# Patient Record
Sex: Female | Born: 1980 | Race: White | Hispanic: No | Marital: Single
Health system: Southern US, Community
[De-identification: ages and names within clinical notes are randomized; demographics above are authoritative.]

---

## 2005-11-08 ENCOUNTER — Emergency Department: Payer: Self-pay | Admitting: Emergency Medicine

## 2007-10-28 ENCOUNTER — Emergency Department: Payer: Self-pay | Admitting: Emergency Medicine

## 2008-06-18 ENCOUNTER — Observation Stay: Payer: Self-pay

## 2008-06-19 ENCOUNTER — Observation Stay: Payer: Self-pay | Admitting: Obstetrics and Gynecology

## 2008-06-20 ENCOUNTER — Observation Stay: Payer: Self-pay

## 2008-06-22 ENCOUNTER — Inpatient Hospital Stay: Payer: Self-pay

## 2008-11-02 ENCOUNTER — Emergency Department: Payer: Self-pay | Admitting: Unknown Physician Specialty

## 2009-02-19 ENCOUNTER — Emergency Department: Payer: Self-pay | Admitting: Emergency Medicine

## 2012-05-26 ENCOUNTER — Inpatient Hospital Stay: Payer: Self-pay | Admitting: Obstetrics & Gynecology

## 2012-05-26 LAB — PROTEIN / CREATININE RATIO, URINE
Creatinine, Urine: 134.4 mg/dL — ABNORMAL HIGH (ref 30.0–125.0)
Protein, Random Urine: 23 mg/dL — ABNORMAL HIGH (ref 0–12)

## 2012-05-26 LAB — DRUG SCREEN, URINE
Barbiturates, Ur Screen: NEGATIVE (ref ?–200)
Cannabinoid 50 Ng, Ur ~~LOC~~: POSITIVE (ref ?–50)
Cocaine Metabolite,Ur ~~LOC~~: NEGATIVE (ref ?–300)
MDMA (Ecstasy)Ur Screen: NEGATIVE (ref ?–500)
Methadone, Ur Screen: NEGATIVE (ref ?–300)
Phencyclidine (PCP) Ur S: NEGATIVE (ref ?–25)
Tricyclic, Ur Screen: NEGATIVE (ref ?–1000)

## 2012-05-26 LAB — CBC WITH DIFFERENTIAL/PLATELET
Basophil #: 0.1 10*3/uL (ref 0.0–0.1)
Eosinophil #: 0 10*3/uL (ref 0.0–0.7)
Eosinophil %: 0.2 %
Lymphocyte #: 5.5 10*3/uL — ABNORMAL HIGH (ref 1.0–3.6)
Lymphocyte %: 27.8 %
MCH: 28.3 pg (ref 26.0–34.0)
Monocyte %: 4.5 %
Neutrophil #: 13.3 10*3/uL — ABNORMAL HIGH (ref 1.4–6.5)
Neutrophil %: 67.1 %
Platelet: 230 10*3/uL (ref 150–440)
RBC: 4.46 10*6/uL (ref 3.80–5.20)
RDW: 13.4 % (ref 11.5–14.5)
WBC: 19.8 10*3/uL — ABNORMAL HIGH (ref 3.6–11.0)

## 2013-03-07 ENCOUNTER — Emergency Department: Payer: Self-pay | Admitting: Emergency Medicine

## 2013-03-07 LAB — URINALYSIS, COMPLETE
Bacteria: NONE SEEN
Bilirubin,UR: NEGATIVE
Blood: NEGATIVE
GLUCOSE, UR: NEGATIVE mg/dL (ref 0–75)
KETONE: NEGATIVE
LEUKOCYTE ESTERASE: NEGATIVE
Nitrite: NEGATIVE
PH: 7 (ref 4.5–8.0)
Protein: NEGATIVE
RBC,UR: NONE SEEN /HPF (ref 0–5)
SPECIFIC GRAVITY: 1.001 (ref 1.003–1.030)
Squamous Epithelial: <1
WBC UR: NONE SEEN /HPF (ref 0–5)
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
## 2015-05-28 ENCOUNTER — Emergency Department
Admission: EM | Admit: 2015-05-28 | Discharge: 2015-05-29 | Disposition: A | Discharge status: Home / Self Care | Payer: Self-pay | Attending: Emergency Medicine | Admitting: Emergency Medicine

## 2015-05-28 ENCOUNTER — Emergency Department: Payer: Self-pay

## 2015-05-28 ENCOUNTER — Encounter: Payer: Self-pay | Admitting: Emergency Medicine

## 2015-05-28 DIAGNOSIS — T404X1A Poisoning by other synthetic narcotics, accidental (unintentional), initial encounter: Secondary | ICD-10-CM | POA: Insufficient documentation

## 2015-05-28 DIAGNOSIS — T401X1A Poisoning by heroin, accidental (unintentional), initial encounter: Secondary | ICD-10-CM | POA: Insufficient documentation

## 2015-05-28 DIAGNOSIS — T50901A Poisoning by unspecified drugs, medicaments and biological substances, accidental (unintentional), initial encounter: Secondary | ICD-10-CM

## 2015-05-28 LAB — URINE DRUG SCREEN, QUALITATIVE (ARMC ONLY)
AMPHETAMINES, UR SCREEN: POSITIVE — AB
Barbiturates, Ur Screen: NOT DETECTED
Benzodiazepine, Ur Scrn: POSITIVE — AB
CANNABINOID 50 NG, UR ~~LOC~~: POSITIVE — AB
Cocaine Metabolite,Ur ~~LOC~~: POSITIVE — AB
MDMA (ECSTASY) UR SCREEN: NOT DETECTED
Methadone Scn, Ur: POSITIVE — AB
OPIATE, UR SCREEN: POSITIVE — AB
PHENCYCLIDINE (PCP) UR S: NOT DETECTED
Tricyclic, Ur Screen: NOT DETECTED

## 2015-05-28 LAB — CBC WITH DIFFERENTIAL/PLATELET
BASOS ABS: 0.1 10*3/uL (ref 0–0.1)
BASOS PCT: 1 %
EOS ABS: 0.4 10*3/uL (ref 0–0.7)
Eosinophils Relative: 5 %
HCT: 38.2 % (ref 35.0–47.0)
HEMOGLOBIN: 12.3 g/dL (ref 12.0–16.0)
Lymphocytes Relative: 41 %
Lymphs Abs: 3.5 10*3/uL (ref 1.0–3.6)
MCH: 26.5 pg (ref 26.0–34.0)
MCHC: 32.2 g/dL (ref 32.0–36.0)
MCV: 82 fL (ref 80.0–100.0)
MONOS PCT: 8 %
Monocytes Absolute: 0.7 10*3/uL (ref 0.2–0.9)
NEUTROS ABS: 4 10*3/uL (ref 1.4–6.5)
NEUTROS PCT: 45 %
Platelets: 396 10*3/uL (ref 150–440)
RBC: 4.65 MIL/uL (ref 3.80–5.20)
RDW: 14.9 % — ABNORMAL HIGH (ref 11.5–14.5)
WBC: 8.7 10*3/uL (ref 3.6–11.0)

## 2015-05-28 LAB — COMPREHENSIVE METABOLIC PANEL
ALK PHOS: 123 U/L (ref 38–126)
ALT: 89 U/L — AB (ref 14–54)
ANION GAP: 9 (ref 5–15)
AST: 30 U/L (ref 15–41)
Albumin: 4.3 g/dL (ref 3.5–5.0)
BILIRUBIN TOTAL: 0.3 mg/dL (ref 0.3–1.2)
BUN: 14 mg/dL (ref 6–20)
CALCIUM: 9.6 mg/dL (ref 8.9–10.3)
CO2: 26 mmol/L (ref 22–32)
CREATININE: 0.89 mg/dL (ref 0.44–1.00)
Chloride: 105 mmol/L (ref 101–111)
Glucose, Bld: 101 mg/dL — ABNORMAL HIGH (ref 65–99)
Potassium: 3.1 mmol/L — ABNORMAL LOW (ref 3.5–5.1)
Sodium: 140 mmol/L (ref 135–145)
TOTAL PROTEIN: 7.8 g/dL (ref 6.5–8.1)

## 2015-05-28 LAB — URINALYSIS COMPLETE WITH MICROSCOPIC (ARMC ONLY)
BILIRUBIN URINE: NEGATIVE
Bacteria, UA: NONE SEEN
Glucose, UA: NEGATIVE mg/dL
Hgb urine dipstick: NEGATIVE
KETONES UR: NEGATIVE mg/dL
Leukocytes, UA: NEGATIVE
NITRITE: NEGATIVE
PH: 6 (ref 5.0–8.0)
Protein, ur: 100 mg/dL — AB
RBC / HPF: NONE SEEN RBC/hpf (ref 0–5)
SPECIFIC GRAVITY, URINE: 1.024 (ref 1.005–1.030)

## 2015-05-28 LAB — PREGNANCY, URINE: PREG TEST UR: NEGATIVE
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
## 2015-05-28 NOTE — Discharge Instructions (Signed)
Accidental Overdose °A drug overdose occurs when a chemical substance (drug or medication) is used in amounts large enough to overcome a person. This may result in severe illness or death. This is a type of poisoning. Accidental overdoses of medications or other substances come from a variety of reasons. When this happens accidentally, it is often because the person taking the substance does not know enough about what they have taken. Drugs which commonly cause overdose deaths are alcohol, psychotropic medications (medications which affect the mind), pain medications, illegal drugs (street drugs) such as cocaine and heroin, and multiple drugs taken at the same time. It may result from careless behavior (such as over-indulging at a party). Other causes of overdose may include multiple drug use, a lapse in memory, or drug use after a period of no drug use.  °Sometimes overdosing occurs because a person cannot remember if they have taken their medication.  °A common unintentional overdose in young children involves multi-vitamins containing iron. Iron is a part of the hemoglobin molecule in blood. It is used to transport oxygen to living cells. When taken in small amounts, iron allows the body to restock hemoglobin. In large amounts, it causes problems in the body. If this overdose is not treated, it can lead to death. °Never take medicines that show signs of tampering or do not seem quite right. Never take medicines in the dark or in poor lighting. Read the label and check each dose of medicine before you take it. When adults are poisoned, it happens most often through carelessness or lack of information. Taking medicines in the dark or taking medicine prescribed for someone else to treat the same type of problem is a dangerous practice. °SYMPTOMS  °Symptoms of overdose depend on the medication and amount taken. They can vary from over-activity with stimulant over-dosage, to sleepiness from depressants such as  alcohol, narcotics and tranquilizers. Confusion, dizziness, nausea and vomiting may be present. If problems are severe enough coma and death may result. °DIAGNOSIS  °Diagnosis and management are generally straightforward if the drug is known. Otherwise it is more difficult. At times, certain symptoms and signs exhibited by the patient, or blood tests, can reveal the drug in question.  °TREATMENT  °In an emergency department, most patients can be treated with supportive measures. Antidotes may be available if there has been an overdose of opioids or benzodiazepines. A rapid improvement will often occur if this is the cause of overdose. °At home or away from medical care: °· There may be no immediate problems or warning signs in children. °· Not everything works well in all cases of poisoning. °· Take immediate action. Poisons may act quickly. °· If you think someone has swallowed medicine or a household product, and the person is unconscious, having seizures (convulsions), or is not breathing, immediately call for an ambulance. °IF a person is conscious and appears to be doing OK but has swallowed a poison: °· Do not wait to see what effect the poison will have. Immediately call a poison control center (listed in the white pages of your telephone book under "Poison Control" or inside the front cover with other emergency numbers). Some poison control centers have TTY capability for the deaf. Check with your local center if you or someone in your family requires this service. °· Keep the container so you can read the label on the product for ingredients. °· Describe what, when, and how much was taken and the age and condition of the person poisoned.   Inform them if the person is vomiting, choking, drowsy, shows a change in color or temperature of skin, is conscious or unconscious, or is convulsing.  Do not cause vomiting unless instructed by medical personnel. Do not induce vomiting or force liquids into a person who  is convulsing, unconscious, or very drowsy. Stay calm and in control.   Activated charcoal also is sometimes used in certain types of poisoning and you may wish to add a supply to your emergency medicines. It is available without a prescription. Call a poison control center before using this medication. PREVENTION  Thousands of children die every year from unintentional poisoning. This may be from household chemicals, poisoning from carbon monoxide in a car, taking their parent's medications, or simply taking a few iron pills or vitamins with iron. Poisoning comes from unexpected sources.  Store medicines out of the sight and reach of children, preferably in a locked cabinet. Do not keep medications in a food cabinet. Always store your medicines in a secure place. Get rid of expired medications.  If you have children living with you or have them as occasional guests, you should have child-resistant caps on your medicine containers. Keep everything out of reach. Child proof your home.  If you are called to the telephone or to answer the door while you are taking a medicine, take the container with you or put the medicine out of the reach of small children.  Do not take your medication in front of children. Do not tell your child how good a medication is and how good it is for them. They may get the idea it is more of a treat.  If you are an adult and have accidentally taken an overdose, you need to consider how this happened and what can be done to prevent it from happening again. If this was from a street drug or alcohol, determine if there is a problem that needs addressing. If you are not sure a problems exists, it is easy to talk to a professional and ask them if they think you have a problem. It is better to handle this problem in this way before it happens again and has a much worse consequence.   This information is not intended to replace advice given to you by your health care provider. Make  sure you discuss any questions you have with your health care provider.   Document Released: 04/17/2004 Document Revised: 02/22/2014 Document Reviewed: 07/22/2014 Elsevier Interactive Patient Education 2016 [employer]  Do not do drugs anymore. Make sure you go around somebody who has some Narcan if you do do drugs again you almost died this time
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
## 2015-05-29 NOTE — ED Notes (Signed)
Discharge instructions reviewed with patient. Patient verbalized understanding. Patient ambulated to lobby without difficulty.   

## 2020-11-18 ENCOUNTER — Emergency Department: Payer: Medicaid Other

## 2020-11-18 ENCOUNTER — Ambulatory Visit
Admission: EM | Admit: 2020-11-18 | Discharge: 2020-11-18 | Disposition: A | Discharge status: Home / Self Care | Payer: No Typology Code available for payment source | Attending: Emergency Medicine | Admitting: Emergency Medicine

## 2020-11-18 ENCOUNTER — Other Ambulatory Visit: Payer: Self-pay

## 2020-11-18 DIAGNOSIS — W540XXA Bitten by dog, initial encounter: Secondary | ICD-10-CM | POA: Insufficient documentation

## 2020-11-18 DIAGNOSIS — S71052A Open bite, left hip, initial encounter: Secondary | ICD-10-CM | POA: Insufficient documentation

## 2020-11-18 DIAGNOSIS — T7421XA Adult sexual abuse, confirmed, initial encounter: Secondary | ICD-10-CM

## 2020-11-18 DIAGNOSIS — Z23 Encounter for immunization: Secondary | ICD-10-CM | POA: Insufficient documentation

## 2020-11-18 DIAGNOSIS — S71152A Open bite, left thigh, initial encounter: Secondary | ICD-10-CM | POA: Diagnosis not present

## 2020-11-18 DIAGNOSIS — Z0441 Encounter for examination and observation following alleged adult rape: Secondary | ICD-10-CM | POA: Insufficient documentation

## 2020-11-18 LAB — POC URINE PREG, ED: Preg Test, Ur: NEGATIVE

## 2020-11-18 MED ORDER — ULIPRISTAL ACETATE 30 MG PO TABS
30.0000 mg | ORAL_TABLET | Freq: Once | ORAL | Status: AC
  Administered 2020-11-18: 30 mg via ORAL
  Filled 2020-11-18: qty 1

## 2020-11-18 MED ORDER — METRONIDAZOLE 500 MG PO TABS
2000.0000 mg | ORAL_TABLET | Freq: Once | ORAL | Status: AC
  Administered 2020-11-18: 2000 mg via ORAL
  Filled 2020-11-18: qty 4

## 2020-11-18 MED ORDER — LEVETIRACETAM 750 MG PO TABS: 750.0000 mg | ORAL_TABLET | Freq: Two times a day (BID) | ORAL | 0 refills | Status: AC

## 2020-11-18 MED ORDER — PROMETHAZINE HCL 25 MG PO TABS
25.0000 mg | ORAL_TABLET | Freq: Four times a day (QID) | ORAL | Status: DC | PRN
  Administered 2020-11-18: 25 mg via ORAL
  Filled 2020-11-18: qty 1

## 2020-11-18 MED ORDER — AZITHROMYCIN 500 MG PO TABS
1000.0000 mg | ORAL_TABLET | Freq: Once | ORAL | Status: AC
  Administered 2020-11-18: 1000 mg via ORAL
  Filled 2020-11-18: qty 2

## 2020-11-18 MED ORDER — LEVETIRACETAM 750 MG PO TABS
750.0000 mg | ORAL_TABLET | Freq: Once | ORAL | Status: AC
  Administered 2020-11-18: 750 mg via ORAL
  Filled 2020-11-18: qty 1

## 2020-11-18 MED ORDER — IBUPROFEN 600 MG PO TABS: 600.0000 mg | ORAL_TABLET | Freq: Three times a day (TID) | ORAL | 0 refills | Status: AC | PRN

## 2020-11-18 MED ORDER — GABAPENTIN 300 MG PO CAPS
300.0000 mg | ORAL_CAPSULE | Freq: Once | ORAL | Status: AC
  Administered 2020-11-18: 300 mg via ORAL
  Filled 2020-11-18: qty 1

## 2020-11-18 MED ORDER — AMOXICILLIN-POT CLAVULANATE 875-125 MG PO TABS: 1.0000 | ORAL_TABLET | Freq: Two times a day (BID) | ORAL | 0 refills | Status: AC

## 2020-11-18 MED ORDER — TETANUS-DIPHTH-ACELL PERTUSSIS 5-2.5-18.5 LF-MCG/0.5 IM SUSY
0.5000 mL | PREFILLED_SYRINGE | Freq: Once | INTRAMUSCULAR | Status: AC
Start: 2020-11-18 — End: 2020-11-18
  Administered 2020-11-18: 0.5 mL via INTRAMUSCULAR
  Filled 2020-11-18: qty 0.5

## 2020-11-18 MED ORDER — LIDOCAINE HCL (PF) 1 % IJ SOLN
1.0000 mL | Freq: Once | INTRAMUSCULAR | Status: AC
  Administered 2020-11-18: 1 mL
  Filled 2020-11-18: qty 5

## 2020-11-18 MED ORDER — CEFTRIAXONE SODIUM 1 G IJ SOLR
500.0000 mg | Freq: Once | INTRAMUSCULAR | Status: AC
  Administered 2020-11-18: 500 mg via INTRAMUSCULAR
  Filled 2020-11-18: qty 10

## 2020-11-18 NOTE — ED Notes (Signed)
Pt cleared by MD Roxan Hockey at this time. SANE RN paged at this time.

## 2020-11-18 NOTE — ED Notes (Signed)
Pt is unable to give urine specimen at this time.
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
## 2020-11-18 NOTE — Discharge Instructions (Addendum)
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 program. Check with your pharmacy or law enforcement to find a location. If you cannot return the medication, ask your care team how to get rid of this medication safely. NOTE: This sheet is a summary. It may not cover all possible information. If you have questions about this medicine, talk to your doctor, pharmacist, or health care provider.  2022 Elsevier/Gold  Standard (2020-03-11 09:56:16)    Ulipristal Tablets  What is this medication? ULIPRISTAL (UE li pris tal) can prevent pregnancy. It should be taken as soon as possible in the 5 days (120 hours) after unprotected sex or if you think your contraceptive didn't work. It belongs to a group of medications called emergency contraceptives. It does not prevent HIV or other sexually transmitted  infections (STIs). This medicine may be used for other purposes; ask your health care provider or pharmacist if you have questions. COMMON BRAND NAME(S): ella What should I tell my care team before I take this medication? They need to know if you have any of these conditions: Liver disease An unusual or allergic reaction to ulipristal, other medications, foods, dyes, or preservatives Pregnant or trying to get pregnant Breast-feeding How should I use this medication? Take this medication by mouth with or without food. Your care team may want you to use a quick-response pregnancy test prior to using the tablets. Take your medication as soon as possible and not more than 5 days (120 hours) after the event. This medication can be taken at any time during your menstrual cycle. Follow the dose instructions of your care team exactly. Contact your care team right away if you vomit within 3 hours of taking your medication to discuss if you need to take another tablet. A patient package insert for the product will be given with each prescription and refill. Read this sheet carefully each time. The sheet may change frequently. Contact your care team about the use of this medication in children. Special care may be needed. Overdosage: If you think you have taken too much of this medicine contact a poison control center or emergency room at once. NOTE: This medicine is only for you. Do not share this medicine with others. What if I miss a dose? This medication is not for regular use. If you vomit within 3 hours of taking your dose, contact your care team for instructions. What may interact with this medication? This medication may interact with the following: Barbiturates such as phenobarbital or primidone Birth control pills Bosentan Carbamazepine Certain medications for fungal infections like griseofulvin, itraconazole, and ketoconazole Certain medications for HIV or AIDS or  hepatitis Dabigatran Digoxin Felbamate Fexofenadine Oxcarbazepine Phenytoin Rifampin St. John's Wort Topiramate This list may not describe all possible interactions. Give your health care provider a list of all the medicines, herbs, non-prescription drugs, or dietary supplements you use. Also tell them if you smoke, drink alcohol, or use illegal drugs. Some items may interact with your medicine. What should I watch for while using this medication? Your period may begin a few days earlier or later than expected. If your period is more than 7 days late, pregnancy is possible. See your care team as soon as you can and get a pregnancy test. Talk to your care team before taking this medication if you know or suspect that you are pregnant. Contact your care team if you think you may be pregnant and you have taken this medication. If you have severe abdominal pain about 3 to 5 weeks after taking this medication, you may have a pregnancy outside the womb, which is called an ectopic or tubal pregnancy. Call your care team or go to the nearest emergency room right away if you think this is happening. Discuss birth control options with your care team. Emergency birth control is not to be used routinely to prevent pregnancy. It should not be used more than once  in the same cycle. Birth control pills may not work properly while you are taking this medication. Wait at least 5 days after taking this medication to start or continue other hormone based birth control. Be sure to use a reliable barrier contraceptive method (such as a condom with spermicide) between the time you take this medication and your next period. This medication does not protect you against HIV infection (AIDS) or any other sexually transmitted diseases (STDs). What side effects may I notice from receiving this medication? Side effects that you should report to your care team as soon as possible: Allergic reactions-skin rash, itching, hives,  swelling of the face, lips, tongue, or throat Side effects that usually do not require medical attention (report to your care team if they continue or are bothersome): Dizziness Fatigue Headache Irregular menstrual cycles or spotting Menstrual cramps Nausea Stomach pain This list may not describe all possible side effects. Call your doctor for medical advice about side effects. You may report side effects to FDA at 1-800-FDA-1088.     Promethazine (pack of 3 for home use) Also known as:  Phenergan  Promethazine Tablets  What is this medication? PROMETHAZINE (proe METH a zeen) prevents and treats the symptoms of an allergic reaction. It works by blocking histamine, a substance released by the body during an allergic reaction. It may also help you relax, go to sleep, and relieve nausea, vomiting, or pain before or after procedures. It can also prevent and treat motion sickness. It works by helping your nervous system calm down by blocking substances in the body that may cause nausea and vomiting. It belongs to a group of medications called antihistamines. This medicine may be used for other purposes; ask your health care provider or pharmacist if you have questions. COMMON BRAND NAME(S): Phenergan What should I tell my care team before I take this medication? They need to know if you have any of these conditions: Blockage in your bowel Diabetes Glaucoma Have trouble controlling your muscles Heart disease Liver disease Low blood counts, like low white cell, platelet, or red cell counts Lung or breathing disease, like asthma Parkinson's disease Prostate disease Seizures Stomach or intestine problems Trouble passing urine An unusual or allergic reaction to promethazine, sulfites, other medications, foods, dyes, or preservatives Pregnant or trying to get pregnant Breast-feeding How should I use this medication? Take this medication by mouth with a glass of water. Follow the  directions on the prescription label. Take your doses at regular intervals. Do not take your medication more often than directed. Talk to your care team about the use of this medication in children. Special care may be needed. This medication should not be given to infants and children younger than 64 years old. Overdosage: If you think you have taken too much of this medicine contact a poison control center or emergency room at once. NOTE: This medicine is only for you. Do not share this medicine with others. What if I miss a dose? If you miss a dose, take it as soon as you can. If it is almost time for your next dose, take only that dose. Do not take double or extra doses. What may interact with this medication? Alcohol Antihistamines for allergy, cough, and cold Atropine Certain medications for anxiety or sleep Certain medications for bladder problems like oxybutynin, tolterodine Certain medications for depression like amitriptyline, fluoxetine, sertraline Certain medications for Parkinson's disease like benztropine, trihexyphenidyl Certain medications for stomach problems like dicyclomine, hyoscyamine Certain medications for travel  sickness like scopolamine Epinephrine General anesthetics like halothane, isoflurane, methoxyflurane, propofol Ipratropium MAOIs like Marplan, Nardil, and Parnate Medications for high blood pressure Medications for seizures like phenobarbital, primidone, phenytoin Medications that relax muscles for surgery Metoclopramide Narcotic medications for pain This list may not describe all possible interactions. Give your health care provider a list of all the medicines, herbs, non-prescription drugs, or dietary supplements you use. Also tell them if you smoke, drink alcohol, or use illegal drugs. Some items may interact with your medicine. What should I watch for while using this medication? Visit your care team for regular checks on your progress. Tell your care  team if symptoms do not start to get better or if they get worse. You may get drowsy or dizzy. Do not drive, use machinery, or do anything that needs mental alertness until you know how this medication affects you. To reduce the risk of dizzy or fainting spells, do not stand or sit up quickly, especially if you are an older patient. Alcohol may increase dizziness and drowsiness. Avoid alcoholic drinks. Your mouth may get dry. Chewing sugarless gum or sucking hard candy, and drinking plenty of water may help. Contact your care team if the problem does not go away or is severe. This medication may cause dry eyes and blurred vision. If you wear contact lenses you may feel some discomfort. Lubricating drops may help. See your care team if the problem does not go away or is severe. This medication can make you more sensitive to the sun. Keep out of the sun. If you cannot avoid being in the sun, wear protective clothing and use sunscreen. Do not use sun lamps or tanning beds/booths. This medication may increase blood sugar. Ask your care team if changes in diet or medications are needed if you have diabetes. What side effects may I notice from receiving this medication? Side effects that you should report to your care team as soon as possible: Allergic reactions-skin rash, itching, hives, swelling of the face, lips, tongue, or throat CNS depression-slow or shallow breathing, shortness of breath, feeling faint, dizziness, confusion, trouble staying awake High fever stiff muscles, increased sweating, fast or irregular heartbeat, and confusion, which may be signs of neuroleptic malignant syndrome Infection-fever, chills, cough, or sore throat Liver injury-right upper belly pain, loss of appetite, nausea, light-colored stool, dark yellow or brown urine, yellowing skin or eyes, unusual weakness or fatigue Seizures Sudden eye pain or change in vision such as blurry vision, seeing halos around lights, vision  loss Trouble passing urine Uncontrolled and repetitive body movements, muscle stiffness or spasms, tremors or shaking, loss of balance or coordination, restlessness, shuffling walk, which may be signs of extrapyramidal symptoms (EPS) Side effects that usually do not require medical attention (report to your care team if they continue or are bothersome): Confusion Constipation Dizziness Drowsiness Dry mouth Sensitivity to light Vivid dreams or nightmares This list may not describe all possible side effects. Call your doctor for medical advice about side effects. You may report side effects to FDA at 1-800-FDA-1088. Where should I keep my medication? Keep out of the reach of children. Store at room temperature, between 20 and 25 degrees C (68 and 77 degrees F). Protect from light. Throw away any unused medication after the expiration date. NOTE: This sheet is a summary. It may not cover all possible information. If you have questions about this medicine, talk to your doctor, pharmacist, or health care provider.  2022 Elsevier/Gold Standard (2020-05-13 10:57:27)
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
## 2020-11-18 NOTE — ED Notes (Signed)
SANE nurse paged

## 2020-11-19 ENCOUNTER — Emergency Department
Admission: EM | Admit: 2020-11-19 | Discharge: 2020-11-19 | Disposition: A | Discharge status: Home / Self Care | Payer: Medicaid Other | Attending: Student in an Organized Health Care Education/Training Program | Admitting: Student in an Organized Health Care Education/Training Program

## 2020-11-19 DIAGNOSIS — W540XXA Bitten by dog, initial encounter: Secondary | ICD-10-CM

## 2020-11-19 DIAGNOSIS — T7421XA Adult sexual abuse, confirmed, initial encounter: Secondary | ICD-10-CM
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
